# Patient Record
Sex: Male | Born: 1990 | Race: Black or African American | Hispanic: No | Marital: Single | Smoking: Never smoker
Health system: Southern US, Community
[De-identification: ages and names within clinical notes are randomized; demographics above are authoritative.]

---
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
## 2014-02-22 ENCOUNTER — Encounter (HOSPITAL_COMMUNITY): Payer: Self-pay | Admitting: Emergency Medicine

## 2014-02-22 ENCOUNTER — Emergency Department (HOSPITAL_COMMUNITY)
Admission: EM | Admit: 2014-02-22 | Discharge: 2014-02-22 | Disposition: A | Discharge status: Home / Self Care | Payer: No Typology Code available for payment source | Attending: Emergency Medicine | Admitting: Emergency Medicine

## 2014-02-22 DIAGNOSIS — S46811A Strain of other muscles, fascia and tendons at shoulder and upper arm level, right arm, initial encounter: Secondary | ICD-10-CM | POA: Diagnosis not present

## 2014-02-22 DIAGNOSIS — S199XXA Unspecified injury of neck, initial encounter: Secondary | ICD-10-CM | POA: Diagnosis not present

## 2014-02-22 DIAGNOSIS — Y9389 Activity, other specified: Secondary | ICD-10-CM | POA: Insufficient documentation

## 2014-02-22 DIAGNOSIS — Y92410 Unspecified street and highway as the place of occurrence of the external cause: Secondary | ICD-10-CM | POA: Diagnosis not present

## 2014-02-22 DIAGNOSIS — S39012A Strain of muscle, fascia and tendon of lower back, initial encounter: Secondary | ICD-10-CM | POA: Insufficient documentation

## 2014-02-22 DIAGNOSIS — S3992XA Unspecified injury of lower back, initial encounter: Secondary | ICD-10-CM | POA: Diagnosis present
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
## 2014-02-24 NOTE — ED Provider Notes (Signed)
Medical screening examination/treatment/procedure(s) were performed by non-physician practitioner and as supervising physician I was immediately available for consultation/collaboration.   EKG Interpretation None        Audree CamelScott T Isel Skufca, MD 02/24/14 2334

## 2017-08-28 ENCOUNTER — Encounter (HOSPITAL_COMMUNITY): Payer: Self-pay | Admitting: Emergency Medical Services

## 2017-08-28 ENCOUNTER — Emergency Department (HOSPITAL_COMMUNITY)
Admission: EM | Admit: 2017-08-28 | Discharge: 2017-08-28 | Disposition: A | Discharge status: Home / Self Care | Payer: Self-pay | Attending: Emergency Medicine | Admitting: Emergency Medicine

## 2017-08-28 ENCOUNTER — Emergency Department (HOSPITAL_COMMUNITY): Payer: Self-pay

## 2017-08-28 ENCOUNTER — Other Ambulatory Visit: Payer: Self-pay

## 2017-08-28 DIAGNOSIS — Y929 Unspecified place or not applicable: Secondary | ICD-10-CM | POA: Insufficient documentation

## 2017-08-28 DIAGNOSIS — Y9389 Activity, other specified: Secondary | ICD-10-CM | POA: Insufficient documentation

## 2017-08-28 DIAGNOSIS — Y998 Other external cause status: Secondary | ICD-10-CM | POA: Insufficient documentation

## 2017-08-28 DIAGNOSIS — S0181XA Laceration without foreign body of other part of head, initial encounter: Secondary | ICD-10-CM | POA: Insufficient documentation

## 2017-08-28 DIAGNOSIS — Z23 Encounter for immunization: Secondary | ICD-10-CM | POA: Insufficient documentation

## 2017-08-28 DIAGNOSIS — W01198A Fall on same level from slipping, tripping and stumbling with subsequent striking against other object, initial encounter: Secondary | ICD-10-CM | POA: Insufficient documentation

## 2017-08-28 MED ORDER — LIDOCAINE-EPINEPHRINE (PF) 2 %-1:200000 IJ SOLN
10.0000 mL | Freq: Once | INTRAMUSCULAR | Status: AC
  Administered 2017-08-28: 10 mL via INTRADERMAL
  Filled 2017-08-28: qty 20

## 2017-08-28 MED ORDER — TETANUS-DIPHTH-ACELL PERTUSSIS 5-2.5-18.5 LF-MCG/0.5 IM SUSP
0.5000 mL | Freq: Once | INTRAMUSCULAR | Status: AC
  Administered 2017-08-28: 0.5 mL via INTRAMUSCULAR
  Filled 2017-08-28: qty 0.5

## 2017-08-28 NOTE — Discharge Instructions (Addendum)
Scan of your head and face was reassuring.  Return to the emergency department in a week to have your stitches removed.  You can also go to urgent care to get this done.  As we discussed to gently wash the wound with warm soapy water starting tomorrow.  You can place Vaseline or Neosporin over the wound to help with scarring.  Look out for any signs of infection like swelling, redness or fever greater than 100.4 F, if this occurs please return to the ER immediately.

## 2017-08-28 NOTE — ED Provider Notes (Signed)
[facility] EMERGENCY DEPARTMENT Provider Note   CSN: [id number] Arrival date & time: 08/28/17  4098     History   Chief Complaint Chief Complaint  Patient presents with  . Laceration    HPI Tannar Broker is a 27 y.o. male.  HPI   Mr. Colleran is a 27yo male with no significant past medical history who presents to the emergency department for evaluation of right eyebrow laceration.  Patient states that he had a few drinks last night and was climbing over a fence.  He states that he lost his footing and fell forward. The fence was approximately 17ft tall. Hit his face on the concrete. Denies loss of consciousness. Reports that he noticed the cut on his face and was able to control bleeding with pressure. Denies headache, visual disturbance, nausea/vomiting, numbness, weakness, nose bleed, neck pain, back pain, chest pain, SOB, abdominal pain, confusion, memory loss. He is unsure of his last Tetanus Vaccination.   No past medical history on file.  There are no active problems to display for this patient.   History reviewed. No pertinent surgical history.      Home Medications    Prior to Admission medications   Not on File    Family History No family history on file.  Social History Social History   Tobacco Use  . Smoking status: Not on file  Substance Use Topics  . Alcohol use: Not on file  . Drug use: Not on file     Allergies   Patient has no known allergies.   Review of Systems Review of Systems  Constitutional: Negative for chills and fever.  HENT: Negative for nosebleeds.   Eyes: Negative for visual disturbance.  Respiratory: Negative for shortness of breath.   Cardiovascular: Negative for chest pain.  Gastrointestinal: Negative for abdominal pain, nausea and vomiting.  Musculoskeletal: Negative for gait problem.  Skin: Positive for color change (ecchymosis over right eye) and wound.  Neurological: Negative for dizziness, seizures,  syncope, speech difficulty, weakness, light-headedness, numbness and headaches.  Psychiatric/Behavioral: Negative for agitation and confusion.     Physical Exam Updated Vital Signs BP 132/90   Pulse 95   Temp 98.3 F (36.8 C) (Oral)   Ht 6' (1.829 m)   Wt 78.5 kg (173 lb)   SpO2 99%   BMI 23.46 kg/m   Physical Exam  Constitutional: He is oriented to person, place, and time. He appears well-developed and well-nourished. No distress.  Sitting at bedside in no apparent distress.   HENT:  Head: Normocephalic and atraumatic.  Mouth/Throat: Oropharynx is clear and moist. No oropharyngeal exudate.  Eyes: Pupils are equal, round, and reactive to light. Conjunctivae are normal. Right eye exhibits no discharge. Left eye exhibits no discharge.  Ecchymosis noted over the lower lid of the right eye.  2 cm laceration noted superior to the right eyebrow.  Tender to palpation over inferior orbit.  No crepitus.  Full EOM. PERRL.  Bilateral TMs with good cone of light, no hemotympanum.   Neck: Normal range of motion. Neck supple.  No midline C-spine tenderness.  Cardiovascular: Normal rate and regular rhythm. Exam reveals no friction rub.  No murmur heard. Pulmonary/Chest: Effort normal and breath sounds normal. No stridor. No respiratory distress. He has no wheezes. He has no rales.  Abdominal: Soft. Bowel sounds are normal. There is no tenderness.  Musculoskeletal:  No midline T-spine or L-spine tenderness.  Neurological: He is alert and oriented to person, place, and time.  Coordination normal.  Mental Status:  Alert, oriented, thought content appropriate, able to give a coherent history. Speech fluent without evidence of aphasia. Able to follow 2 step commands without difficulty.  Cranial Nerves:  II:  Peripheral visual fields grossly normal, pupils equal, round, reactive to light III,IV, VI: ptosis not present, extra-ocular motions intact bilaterally  V,VII: smile symmetric, facial light  touch sensation equal VIII: hearing grossly normal to voice  X: uvula elevates symmetrically  XI: bilateral shoulder shrug symmetric and strong XII: midline tongue extension without fassiculations Motor:  Normal tone. 5/5 in upper and lower extremities bilaterally including strong and equal grip strength and dorsiflexion/plantar flexion Sensory: Pinprick and light touch normal in all extremities.  Cerebellar: normal finger-to-nose with bilateral upper extremities  Skin: Skin is warm and dry. Capillary refill takes less than 2 seconds. He is not diaphoretic.  Psychiatric: He has a normal mood and affect. His behavior is normal.  Nursing note and vitals reviewed.    ED Treatments / Results  Labs (all labs ordered are listed, but only abnormal results are displayed) Labs Reviewed - No data to display  EKG None  Radiology Ct Head Wo Contrast  Result Date: 08/28/2017 CLINICAL DATA:  Head trauma.  Pain. EXAM: CT HEAD WITHOUT CONTRAST CT MAXILLOFACIAL WITHOUT CONTRAST TECHNIQUE: Multidetector CT imaging of the head and maxillofacial structures were performed using the standard protocol without intravenous contrast. Multiplanar CT image reconstructions of the maxillofacial structures were also generated. COMPARISON:  None. FINDINGS: CT HEAD FINDINGS Brain: No evidence of acute infarction, hemorrhage, hydrocephalus, extra-axial collection or mass lesion/mass effect. Vascular: No hyperdense vessel or unexpected calcification. Skull: Normal. Negative for fracture or focal lesion. Other: None. CT MAXILLOFACIAL FINDINGS Osseous: There is an unusual configuration to the odontoid process which is either developmental or from previous remote trauma. No acute fractures are seen. Orbits: Negative. No traumatic or inflammatory finding. Sinuses: There is a mucous retention cyst in the right maxillary sinus anteriorly. Paranasal sinuses are otherwise unremarkable. Mastoid air cells and middle ears are normal.  Soft tissues: Laceration over the right forehead. IMPRESSION: 1. No acute intracranial abnormality. 2. No acute facial bone fractures identified. Electronically Signed   By: Gerome Sam III M.D   On: 08/28/2017 09:02   Ct Maxillofacial Wo Contrast  Result Date: 08/28/2017 CLINICAL DATA:  Head trauma.  Pain. EXAM: CT HEAD WITHOUT CONTRAST CT MAXILLOFACIAL WITHOUT CONTRAST TECHNIQUE: Multidetector CT imaging of the head and maxillofacial structures were performed using the standard protocol without intravenous contrast. Multiplanar CT image reconstructions of the maxillofacial structures were also generated. COMPARISON:  None. FINDINGS: CT HEAD FINDINGS Brain: No evidence of acute infarction, hemorrhage, hydrocephalus, extra-axial collection or mass lesion/mass effect. Vascular: No hyperdense vessel or unexpected calcification. Skull: Normal. Negative for fracture or focal lesion. Other: None. CT MAXILLOFACIAL FINDINGS Osseous: There is an unusual configuration to the odontoid process which is either developmental or from previous remote trauma. No acute fractures are seen. Orbits: Negative. No traumatic or inflammatory finding. Sinuses: There is a mucous retention cyst in the right maxillary sinus anteriorly. Paranasal sinuses are otherwise unremarkable. Mastoid air cells and middle ears are normal. Soft tissues: Laceration over the right forehead. IMPRESSION: 1. No acute intracranial abnormality. 2. No acute facial bone fractures identified. Electronically Signed   By: Gerome Sam III M.D   On: 08/28/2017 09:02    Procedures .Marland KitchenLaceration Repair Date/Time: 08/28/2017 10:42 AM Performed by: Kellie Shropshire, PA-C Authorized by: Kellie Shropshire, PA-C   Consent:  Consent obtained:  Verbal and emergent situation   Consent given by:  Patient   Risks discussed:  Infection, pain, poor cosmetic result, poor wound healing and retained foreign body   Alternatives discussed:  No  treatment Anesthesia (see MAR for exact dosages):    Anesthesia method:  Local infiltration   Local anesthetic:  Lidocaine 2% WITH epi Laceration details:    Location:  Face   Face location:  Forehead   Length (cm):  2   Depth (mm):  5 Repair type:    Repair type:  Simple Pre-procedure details:    Preparation:  Patient was prepped and draped in usual sterile fashion Exploration:    Hemostasis achieved with:  Direct pressure   Wound exploration: wound explored through full range of motion and entire depth of wound probed and visualized   Treatment:    Area cleansed with:  Betadine   Amount of cleaning:  Standard   Irrigation solution:  Sterile saline   Irrigation volume:  200ccs   Irrigation method:  Pressure wash Skin repair:    Repair method:  Sutures   Suture size:  5-0   Suture material:  Prolene   Suture technique:  Simple interrupted   Number of sutures:  3 Approximation:    Approximation:  Close Post-procedure details:    Dressing:  Non-adherent dressing   Patient tolerance of procedure:  Tolerated well, no immediate complications   (including critical care time)  Medications Ordered in ED Medications  Tdap (BOOSTRIX) injection 0.5 mL (0.5 mLs Intramuscular Given 08/28/17 0821)  lidocaine-EPINEPHrine (XYLOCAINE W/EPI) 2 %-1:200000 (PF) injection 10 mL (10 mLs Intradermal Given 08/28/17 7253)     Initial Impression / Assessment and Plan / ED Course  I have reviewed the triage vital signs and the nursing notes.  Pertinent labs & imaging results that were available during my care of the patient were reviewed by me and considered in my medical decision making (see chart for details).    CT head and maxillofacial scan without acute abnormality.  No disorientation, memory loss, slurred speech or slowing to answer questions. Do not suspect concussion. Pressure irrigation performed to the wound. Wound explored and base of wound visualized in a bloodless field without  evidence of foreign body.  Laceration occurred < 8 hours prior to repair which was well tolerated. Tdap updated.  Pt has no comorbidities to effect normal wound healing. Pt discharged without antibiotics.  Discussed suture home care with patient and answered questions. Pt to follow-up for wound check and suture removal in 7 days; they are to return to the ED sooner for signs of infection. Pt is hemodynamically stable with no complaints prior to dc.   Final Clinical Impressions(s) / ED Diagnoses   Final diagnoses:  Laceration of forehead, initial encounter    ED Discharge Orders    None       Lawrence Marseilles 08/28/17 1253    Lorre Nick, MD 08/29/17 [phone]

## 2017-08-28 NOTE — ED Notes (Signed)
Patient reports that he fell this am striking head, questionable loc. Laceration above right eyebrow with mild swelling and bruising to orbit

## 2017-08-28 NOTE — ED Notes (Signed)
Verbalized understanding of discharge instructions.

## 2017-08-28 NOTE — ED Triage Notes (Signed)
Pt stats that he fell on concrete after climbing a fence. Approximately 1" laceration above right eyebrow.

## 2017-09-01 ENCOUNTER — Encounter (HOSPITAL_COMMUNITY): Payer: Self-pay | Admitting: Emergency Medicine

## 2017-09-05 ENCOUNTER — Ambulatory Visit (HOSPITAL_COMMUNITY)
Admission: EM | Admit: 2017-09-05 | Discharge: 2017-09-05 | Disposition: A | Discharge status: Home / Self Care | Payer: Self-pay

## 2017-09-05 NOTE — ED Triage Notes (Signed)
3 sutures removed from right eye brow, wound clean, dry and healed.

## 2019-11-29 IMAGING — CT CT HEAD W/O CM
4 of 12 series · 16 of 47 positions shown, 18 images · non-contrast
Comparison: None.

CLINICAL DATA: Head trauma.  Pain.

EXAM:
CT HEAD WITHOUT CONTRAST
CT MAXILLOFACIAL WITHOUT CONTRAST
TECHNIQUE: Multidetector CT imaging of the head and maxillofacial structures
were performed using the standard protocol without intravenous
contrast. Multiplanar CT image reconstructions of the maxillofacial
structures were also generated.

[Series 5: cor soft · coronal · 0.33mm/px · 2 of 76 slices shown]
[im 26/76  brain]
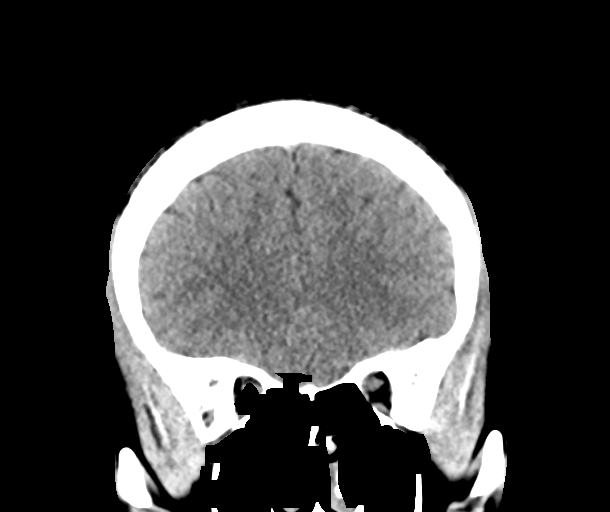
[im 51/76  brain]
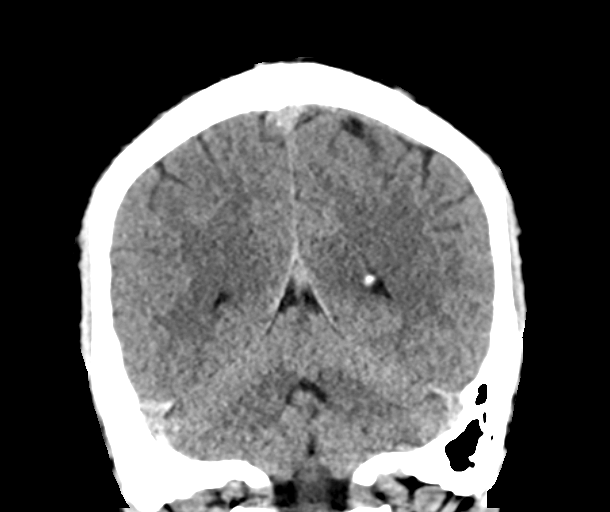

[Series 6: sag soft · sagittal · 0.33mm/px · 1 of 67 slices shown]
[im 34/67  brain]
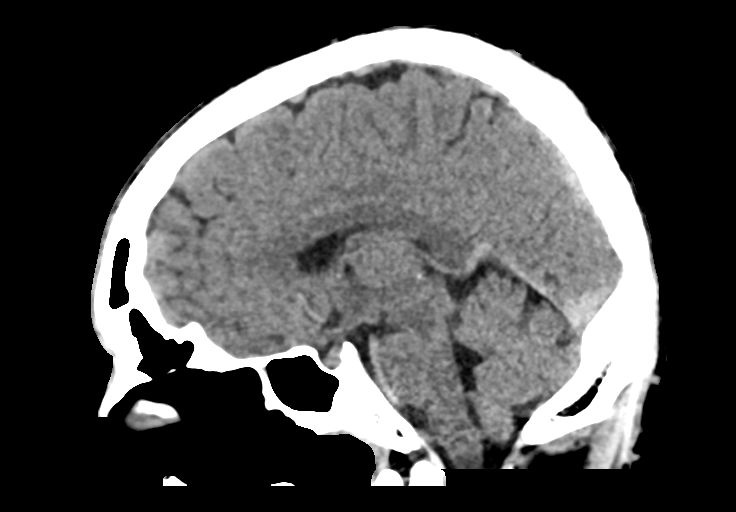

[Series 8: st thins · axial · 0.35mm/px · z∈[-225,-101]mm · 7 of 238 slices shown, 9 images]
[im 30/238  brain]
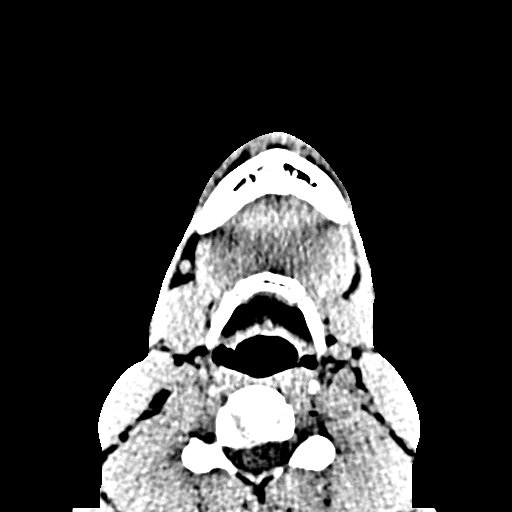
[im 30/238  bone]
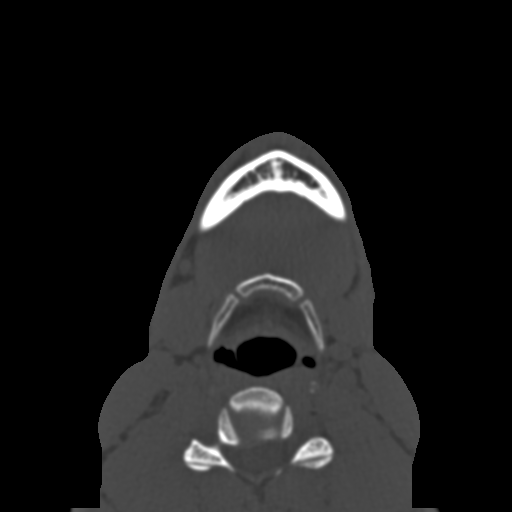
[im 60/238  brain]
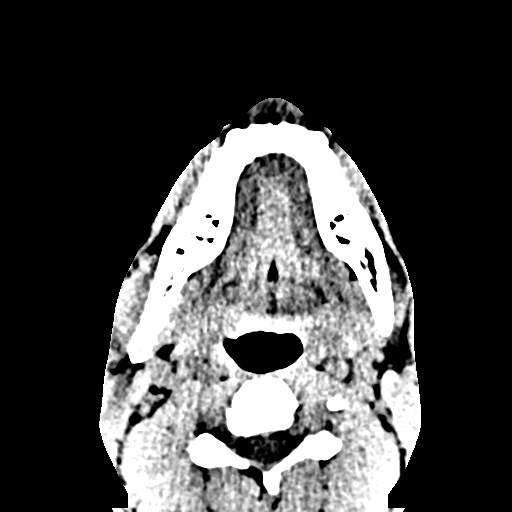
[im 89/238  brain]
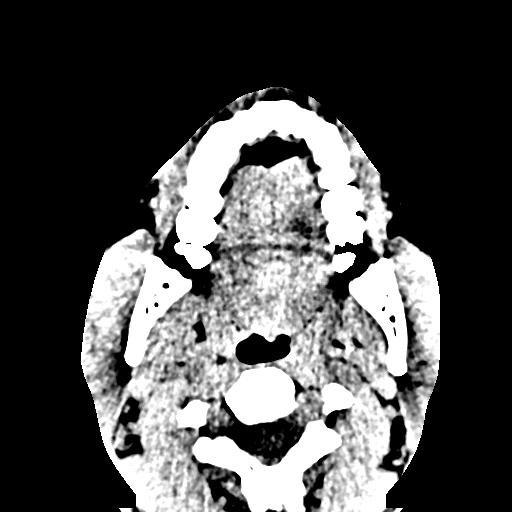
[im 119/238  brain]
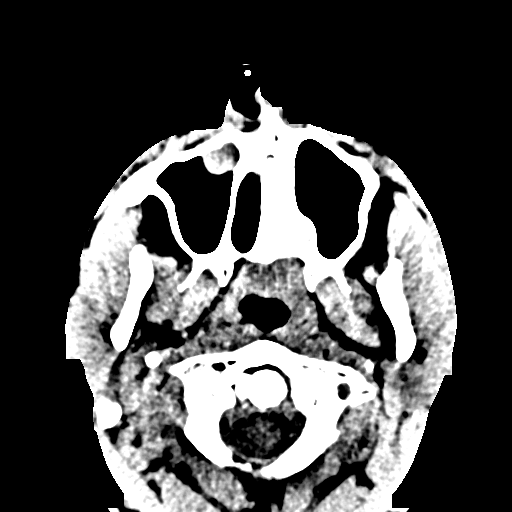
[im 149/238  brain]
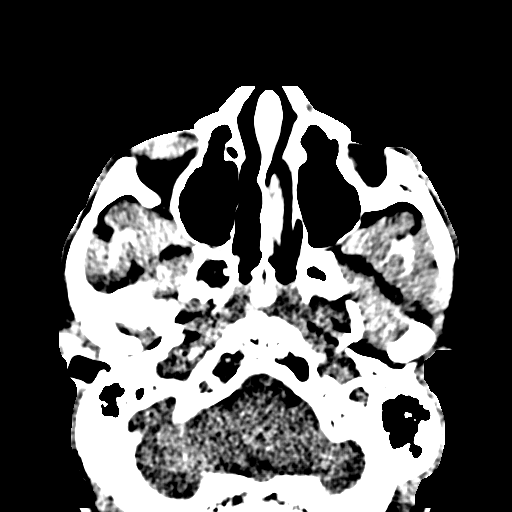
[im 149/238  bone]
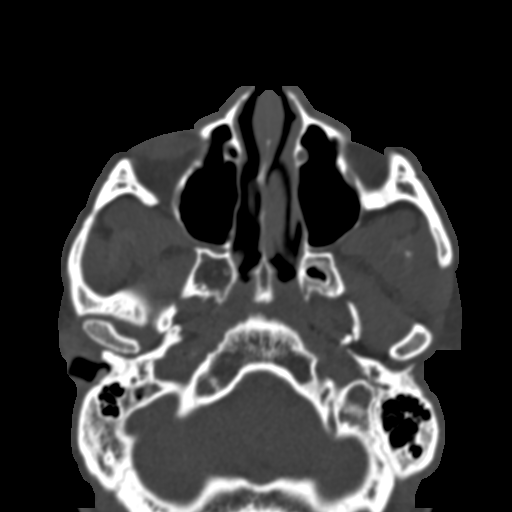
[im 178/238  brain]
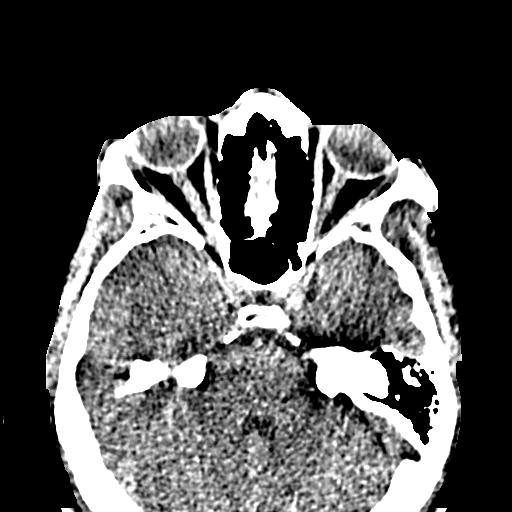
[im 208/238  brain]
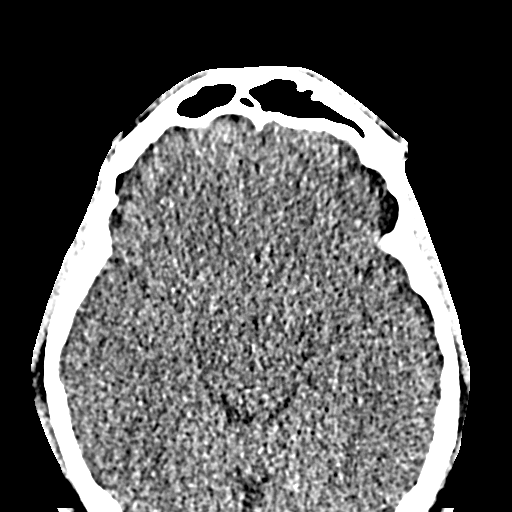

[Series 10: bone thins · axial · 0.35mm/px · z∈[-225,-122]mm · 6 of 238 slices shown]
[im 30/238  bone]
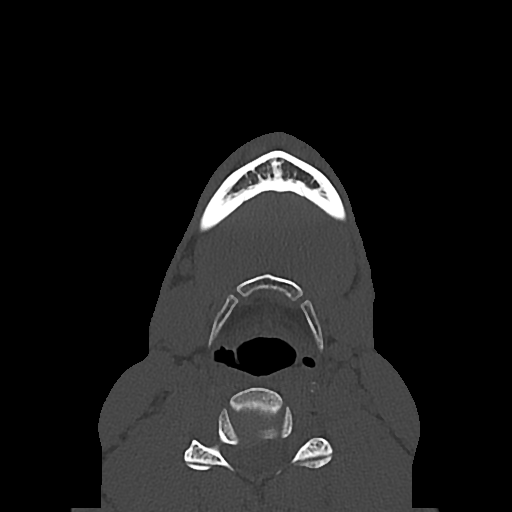
[im 60/238  bone]
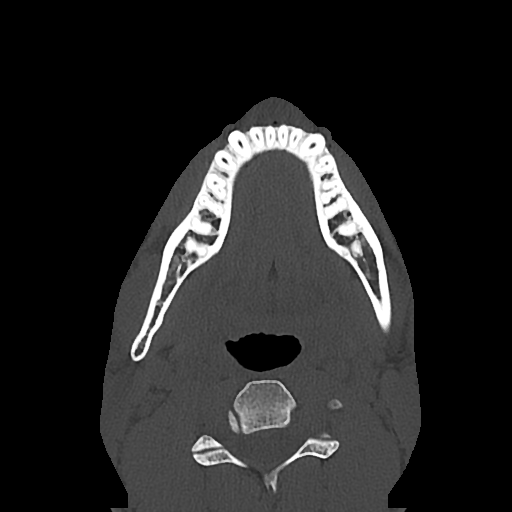
[im 89/238  bone]
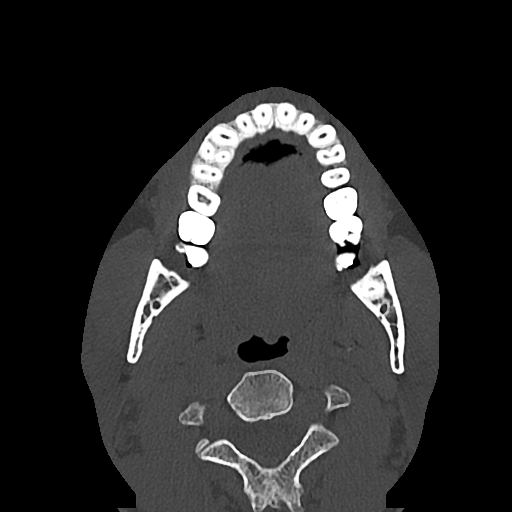
[im 119/238  bone]
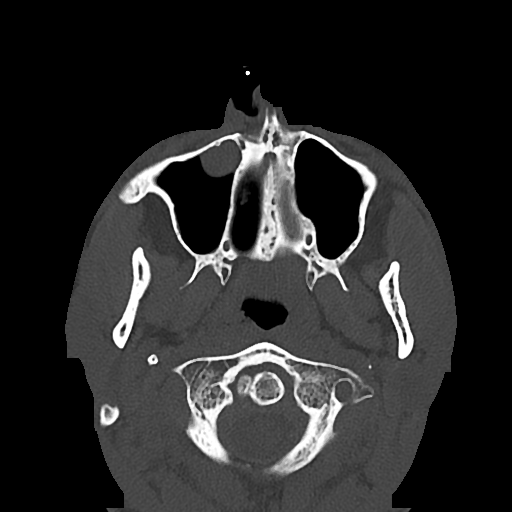
[im 149/238  bone]
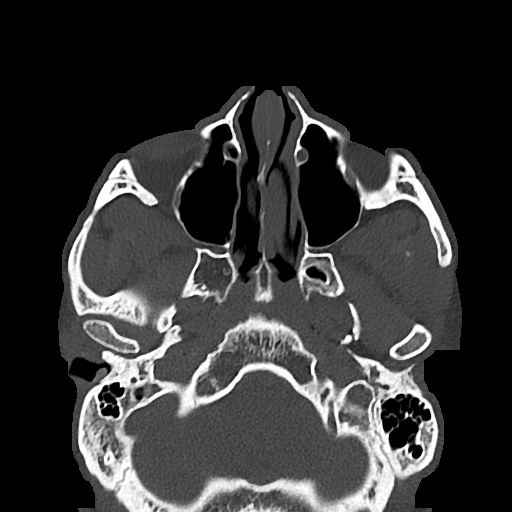
[im 178/238  bone]
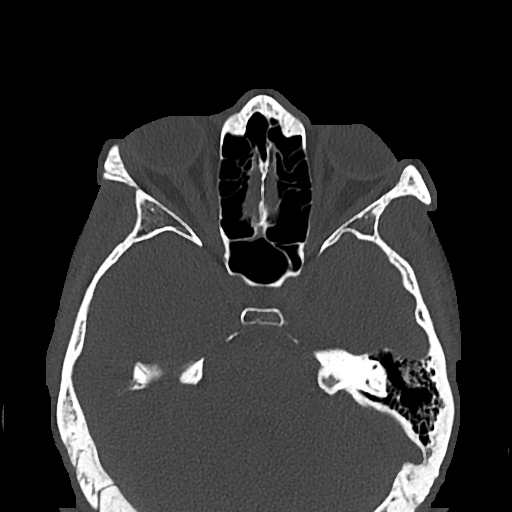

[16 of 47 positions shown; findings below may reference images not displayed]

FINDINGS: CT HEAD FINDINGS

Brain: No evidence of acute infarction, hemorrhage, hydrocephalus,
extra-axial collection or mass lesion/mass effect.

Vascular: No hyperdense vessel or unexpected calcification.

Skull: Normal. Negative for fracture or focal lesion.

Other: None.

CT MAXILLOFACIAL FINDINGS

Osseous: There is an unusual configuration to the odontoid process
which is either developmental or from previous remote trauma. No
acute fractures are seen.

Orbits: Negative. No traumatic or inflammatory finding.

Sinuses: There is a mucous retention cyst in the right maxillary
sinus anteriorly. Paranasal sinuses are otherwise unremarkable.
Mastoid air cells and middle ears are normal.

Soft tissues: Laceration over the right forehead.
IMPRESSION: 1. No acute intracranial abnormality.
2. No acute facial bone fractures identified.
